# Patient Record
Sex: Male | Born: 1995 | Race: White | Hispanic: No | Marital: Single | Smoking: Current every day smoker
Health system: Southern US, Community
[De-identification: ages and names within clinical notes are randomized; demographics above are authoritative.]

## PROBLEM LIST (undated history)

## (undated) DIAGNOSIS — J45909 Unspecified asthma, uncomplicated: Secondary | ICD-10-CM

## (undated) HISTORY — PX: TONSILLECTOMY: SUR1361

---
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
## 2015-10-25 ENCOUNTER — Encounter: Payer: Self-pay | Admitting: *Deleted

## 2015-10-25 ENCOUNTER — Emergency Department
Admission: EM | Admit: 2015-10-25 | Discharge: 2015-10-25 | Disposition: A | Discharge status: Home / Self Care | Payer: Medicaid Other | Attending: Emergency Medicine | Admitting: Emergency Medicine

## 2015-10-25 DIAGNOSIS — F17210 Nicotine dependence, cigarettes, uncomplicated: Secondary | ICD-10-CM | POA: Insufficient documentation

## 2015-10-25 DIAGNOSIS — Z0000 Encounter for general adult medical examination without abnormal findings: Secondary | ICD-10-CM

## 2015-10-25 DIAGNOSIS — Z5321 Procedure and treatment not carried out due to patient leaving prior to being seen by health care provider: Secondary | ICD-10-CM | POA: Diagnosis not present

## 2015-10-25 DIAGNOSIS — Z046 Encounter for general psychiatric examination, requested by authority: Secondary | ICD-10-CM | POA: Diagnosis present

## 2015-10-25 NOTE — ED Triage Notes (Signed)
Pt needs medical clearance for RTS , pt has been abusing Xanax with ETOH, pt last drank ETOH 2 nights ago, Xanax 2mg  today

## 2015-10-26 NOTE — ED Provider Notes (Signed)
I never evaluated this patient. Patient dismissed from ed prior to medical evaluation.   Nita Sicklearolina Johntavius Shepard, MD 10/26/15 2320

## 2017-05-28 ENCOUNTER — Emergency Department
Admission: EM | Admit: 2017-05-28 | Discharge: 2017-05-28 | Disposition: A | Discharge status: Home / Self Care | Payer: Self-pay | Attending: Emergency Medicine | Admitting: Emergency Medicine

## 2017-05-28 ENCOUNTER — Other Ambulatory Visit: Payer: Self-pay

## 2017-05-28 ENCOUNTER — Encounter: Payer: Self-pay | Admitting: Emergency Medicine

## 2017-05-28 ENCOUNTER — Emergency Department: Payer: Self-pay

## 2017-05-28 DIAGNOSIS — J45909 Unspecified asthma, uncomplicated: Secondary | ICD-10-CM | POA: Insufficient documentation

## 2017-05-28 DIAGNOSIS — N132 Hydronephrosis with renal and ureteral calculous obstruction: Secondary | ICD-10-CM | POA: Insufficient documentation

## 2017-05-28 DIAGNOSIS — F17210 Nicotine dependence, cigarettes, uncomplicated: Secondary | ICD-10-CM | POA: Insufficient documentation

## 2017-05-28 DIAGNOSIS — N201 Calculus of ureter: Secondary | ICD-10-CM

## 2017-05-28 DIAGNOSIS — Z79899 Other long term (current) drug therapy: Secondary | ICD-10-CM | POA: Insufficient documentation

## 2017-05-28 DIAGNOSIS — R109 Unspecified abdominal pain: Secondary | ICD-10-CM

## 2017-05-28 HISTORY — DX: Unspecified asthma, uncomplicated: J45.909

## 2017-05-28 LAB — URINALYSIS, COMPLETE (UACMP) WITH MICROSCOPIC
Bacteria, UA: NONE SEEN
Bilirubin Urine: NEGATIVE
Glucose, UA: NEGATIVE mg/dL
Ketones, ur: 5 mg/dL — AB
Leukocytes, UA: NEGATIVE
NITRITE: NEGATIVE
PROTEIN: 30 mg/dL — AB
RBC / HPF: >50 RBC/hpf — ABNORMAL HIGH (ref 0–5)
SPECIFIC GRAVITY, URINE: 1.02 (ref 1.005–1.030)
pH: 6 (ref 5.0–8.0)

## 2017-05-28 LAB — CBC
HCT: 49.4 % (ref 40.0–52.0)
HEMOGLOBIN: 16.4 g/dL (ref 13.0–18.0)
MCH: 27.9 pg (ref 26.0–34.0)
MCHC: 33.2 g/dL (ref 32.0–36.0)
MCV: 83.9 fL (ref 80.0–100.0)
Platelets: 228 10*3/uL (ref 150–440)
RBC: 5.88 MIL/uL (ref 4.40–5.90)
RDW: 13.7 % (ref 11.5–14.5)
WBC: 17.2 10*3/uL — AB (ref 3.8–10.6)

## 2017-05-28 LAB — BASIC METABOLIC PANEL
ANION GAP: 11 (ref 5–15)
BUN: 10 mg/dL (ref 6–20)
CHLORIDE: 102 mmol/L (ref 101–111)
CO2: 25 mmol/L (ref 22–32)
Calcium: 9.5 mg/dL (ref 8.9–10.3)
Creatinine, Ser: 1.06 mg/dL (ref 0.61–1.24)
Glucose, Bld: 147 mg/dL — ABNORMAL HIGH (ref 65–99)
POTASSIUM: 4.5 mmol/L (ref 3.5–5.1)
Sodium: 138 mmol/L (ref 135–145)

## 2017-05-28 MED ORDER — SODIUM CHLORIDE 0.9 % IV BOLUS
1000.0000 mL | Freq: Once | INTRAVENOUS | Status: AC
  Administered 2017-05-28: 1000 mL via INTRAVENOUS

## 2017-05-28 MED ORDER — KETOROLAC TROMETHAMINE 60 MG/2ML IM SOLN
INTRAMUSCULAR | Status: AC
  Administered 2017-05-28: 30 mg
  Filled 2017-05-28: qty 2

## 2017-05-28 MED ORDER — ONDANSETRON HCL 4 MG/2ML IJ SOLN
4.0000 mg | Freq: Once | INTRAMUSCULAR | Status: AC
  Administered 2017-05-28: 4 mg via INTRAVENOUS
  Filled 2017-05-28: qty 2

## 2017-05-28 MED ORDER — HYDROMORPHONE HCL 1 MG/ML IJ SOLN
1.0000 mg | Freq: Once | INTRAMUSCULAR | Status: AC
  Administered 2017-05-28: 1 mg via INTRAVENOUS
  Filled 2017-05-28: qty 1

## 2017-05-28 MED ORDER — OXYCODONE-ACETAMINOPHEN 5-325 MG PO TABS: 1.0000 | ORAL_TABLET | Freq: Four times a day (QID) | ORAL | 0 refills | Status: AC | PRN

## 2017-05-28 MED ORDER — KETOROLAC TROMETHAMINE 10 MG PO TABS: 10.0000 mg | ORAL_TABLET | Freq: Four times a day (QID) | ORAL | 0 refills | Status: AC | PRN

## 2017-05-28 MED ORDER — HYDROMORPHONE HCL 1 MG/ML IJ SOLN
0.5000 mg | Freq: Once | INTRAMUSCULAR | Status: AC
  Administered 2017-05-28: 0.5 mg via INTRAVENOUS
  Filled 2017-05-28: qty 1

## 2017-05-28 MED ORDER — ONDANSETRON 4 MG PO TBDP: 4.0000 mg | ORAL_TABLET | Freq: Three times a day (TID) | ORAL | 0 refills | Status: AC | PRN

## 2017-05-28 MED ORDER — SULFAMETHOXAZOLE-TRIMETHOPRIM 800-160 MG PO TABS: 1.0000 | ORAL_TABLET | Freq: Two times a day (BID) | ORAL | 0 refills | Status: DC

## 2017-05-28 MED ORDER — KETOROLAC TROMETHAMINE 30 MG/ML IJ SOLN: 30.0000 mg | Freq: Once | INTRAMUSCULAR | Status: DC

## 2017-05-28 NOTE — ED Notes (Signed)
Spoke with pt about wait times and what to expect next. Advised pt that I am available for further questions if needed.  

## 2017-05-28 NOTE — ED Notes (Signed)
See triage note presents with sudden onset of left flank pain this am    Positive nausea /vomiting    States pain increases cough and movement   abd is soft and non tender at present  States pain was eased off by meds and he was able to go to sleep for a short while   Now states pain is back

## 2017-05-28 NOTE — ED Provider Notes (Signed)
[facility] Emergency Department Provider Note   ____________________________________________   First MD Initiated Contact with Patient 05/28/17 1307     (approximate)  I have reviewed the triage vital signs and the nursing notes.   HISTORY  Chief Complaint Flank Pain  HPI Bobby Vang is a 22 y.o. male presents to the emergency department with complaint of left flank pain that started suddenly today.  Patient states that he has pain with urination, nausea and vomiting this morning.  Patient denies any previous history of kidney stones and denies injury to the flank area.  He denies any hematuria, fever, chills, penile discharge.  He rates pain as 10/10.  Past Medical History:  Diagnosis Date  . Asthma     There are no active problems to display for this patient.   Past Surgical History:  Procedure Laterality Date  . TONSILLECTOMY      Prior to Admission medications   Medication Sig Start Date End Date Taking? Authorizing Provider  ketorolac (TORADOL) 10 MG tablet Take 1 tablet (10 mg total) by mouth every 6 (six) hours as needed for moderate pain. 05/28/17   Tommi Rumps, PA-C  ondansetron (ZOFRAN ODT) 4 MG disintegrating tablet Take 1 tablet (4 mg total) by mouth every 8 (eight) hours as needed for nausea or vomiting. 05/28/17   Tommi Rumps, PA-C  oxyCODONE-acetaminophen (PERCOCET) 5-325 MG tablet Take 1 tablet by mouth every 6 (six) hours as needed for severe pain. 05/28/17   Tommi Rumps, PA-C  sulfamethoxazole-trimethoprim (BACTRIM DS,SEPTRA DS) 800-160 MG tablet Take 1 tablet by mouth 2 (two) times daily. 05/28/17   Tommi Rumps, PA-C    Allergies Amoxicillin  History reviewed. No pertinent family history.  Social History Social History   Tobacco Use  . Smoking status: Current Every Day Smoker    Packs/day: 1.50    Types: Cigarettes  . Smokeless tobacco: Never Used  Substance Use Topics  . Alcohol use: Yes      Alcohol/week: 50.4 oz    Types: 84 Cans of beer per week    Comment: "not often"  . Drug use: Yes    Types: Marijuana    Review of Systems Constitutional: No fever/chills Cardiovascular: Denies chest pain. Respiratory: Denies shortness of breath. Gastrointestinal: No abdominal pain.  Positive nausea, positive vomiting.  Genitourinary: Positive for dysuria.  Negative for hematuria. Musculoskeletal: Negative for back pain.  For left flank pain. Skin: Negative for rash. Neurological: Negative for headaches, focal weakness or numbness. ___________________________________________   PHYSICAL EXAM:  VITAL SIGNS: ED Triage Vitals  Enc Vitals Group     BP 05/28/17 1303 (!) 137/53     Pulse Rate 05/28/17 1303 60     Resp 05/28/17 1303 (!) 22     Temp 05/28/17 1303 (!) 97.5 F (36.4 C)     Temp Source 05/28/17 1303 Oral     SpO2 05/28/17 1303 100 %     Weight 05/28/17 1132 186 lb (84.4 kg)     Height 05/28/17 1132  (1.727 m)     Head Circumference --      Peak Flow --      Pain Score 05/28/17 1131 10     Pain Loc --      Pain Edu? --      Excl. in GC? --    Constitutional: Alert and oriented. Well appearing and in no distress at this time however the patient is lying on his side  with his knees brought up to his chest. Eyes: Conjunctivae are normal.  Head: Atraumatic. Neck: No stridor.   Cardiovascular: Normal rate, regular rhythm. Grossly normal heart sounds.  Good peripheral circulation. Respiratory: Normal respiratory effort.  No retractions. Lungs CTAB. Gastrointestinal: Soft and nontender. No distention.  Positive left CVA tenderness. Musculoskeletal: Moves upper and lower extremities without any difficulty. Neurologic:  Normal speech and language. No gross focal neurologic deficits are appreciated. No gait instability. Skin:  Skin is warm, dry and intact. No rash noted. Psychiatric: Mood and affect are normal. Speech and behavior are  normal.  ____________________________________________   LABS (all labs ordered are listed, but only abnormal results are displayed)  Labs Reviewed  URINALYSIS, COMPLETE (UACMP) WITH MICROSCOPIC - Abnormal; Notable for the following components:      Result Value   Color, Urine YELLOW (*)    APPearance HAZY (*)    Hgb urine dipstick LARGE (*)    Ketones, ur 5 (*)    Protein, ur 30 (*)    RBC / HPF >50 (*)    All other components within normal limits  BASIC METABOLIC PANEL - Abnormal; Notable for the following components:   Glucose, Bld 147 (*)    All other components within normal limits  CBC - Abnormal; Notable for the following components:   WBC 17.2 (*)    All other components within normal limits   RADIOLOGY   Official radiology report(s): Ct Renal Stone Study  Result Date: 05/28/2017 CLINICAL DATA:  Left flank pain and dysuria.  Nausea and vomiting EXAM: CT ABDOMEN AND PELVIS WITHOUT CONTRAST TECHNIQUE: Multidetector CT imaging of the abdomen and pelvis was performed following the standard protocol without oral or IV contrast. COMPARISON:  None. FINDINGS: Lower chest: Lung bases are clear. Hepatobiliary: No focal liver lesions are evident on this noncontrast enhanced study. Gallbladder wall is not appreciably thickened. There is no biliary duct dilatation. Pancreas: No pancreatic mass or inflammatory focus. Spleen: Spleen measures 14.2 x 12.5 x 6.0 cm with a measured splenic volume of 533 cubic cm. No focal splenic lesions are evident. Adrenals/Urinary Tract: Adrenals bilaterally appear unremarkable. There is no renal mass on either side. There is mild hydronephrosis on left. There is no appreciable hydronephrosis on the right. There is no intrarenal calculus on either side. There is a 1 mm calculus in the left ureter at the L4-5 level. No other ureteral calculi are identified on either side. There are several small pelvic phleboliths which are near but separate from distal ureters.  Urinary bladder is midline with wall thickness within normal limits. Stomach/Bowel: There is no appreciable bowel wall or mesenteric thickening. There is no evident bowel obstruction. No free air or portal venous air. Vascular/Lymphatic: No abdominal aortic aneurysm. No vascular lesions are evident on this noncontrast enhanced study. There are subcentimeter inguinal lymph nodes considered nonspecific. By size criteria, there is no adenopathy in the abdomen or pelvis. Reproductive: Prostate and seminal vesicles are normal in size and contour. There is no pelvic mass. Other: Appendix appears normal. There is no ascites or abscess in the abdomen or pelvis. Musculoskeletal: There are no blastic or lytic bone lesions. There is no intramuscular or abdominal wall lesion. IMPRESSION: 1. 1 mm calculus in the left ureter at the L4 level with mild hydronephrosis on the left. No intrarenal calculi on either side. 2. Splenic prominence of uncertain etiology. No focal splenic lesions are evident on this noncontrast enhanced study. 3.  No bowel obstruction.  No abscess.  Appendix appears normal. Electronically Signed   By: Bretta Bang III M.D.   On: 05/28/2017 15:23    ____________________________________________   PROCEDURES  Procedure(s) performed: None  Procedures  Critical Care performed: No  ____________________________________________   INITIAL IMPRESSION / ASSESSMENT AND PLAN / ED COURSE  As part of my medical decision making, I reviewed the following data within the electronic MEDICAL RECORD NUMBER Notes from prior ED visits and [place] Controlled Substance Database  Patient was given Toradol IV, Zofran and was feeling better until he was seen in the flex area of the emergency department.  Patient then began having some increased pain.  He was given Dilaudid prior to CT scan.  While waiting for the results he again became nauseous and increased pain.  At this time he was given Dilaudid along with Zofran  and was improved prior to discharge.  Patient was made aware that he does have a small stone.  He was given information about stones along with prescription for Percocet, Toradol, Zofran, and Bactrim DS.  Patient is to follow-up with the urologist listed on his discharge papers.  He is encouraged to increase clear liquids especially water, lemonade, ginger ale or Sprite.  Patient was also given a note to remain out of work as he is aware that he cannot drive or operate machinery while taking pain medication.  His mother will also obtain Colace to avoid constipation.  ____________________________________________   FINAL CLINICAL IMPRESSION(S) / ED DIAGNOSES  Final diagnoses:  Ureterolithiasis  Left flank pain     ED Discharge Orders        Ordered    oxyCODONE-acetaminophen (PERCOCET) 5-325 MG tablet  Every 6 hours PRN     05/28/17 1603    ondansetron (ZOFRAN ODT) 4 MG disintegrating tablet  Every 8 hours PRN     05/28/17 1603    ketorolac (TORADOL) 10 MG tablet  Every 6 hours PRN     05/28/17 1603    sulfamethoxazole-trimethoprim (BACTRIM DS,SEPTRA DS) 800-160 MG tablet  2 times daily     05/28/17 1606       Note:  This document was prepared using Dragon voice recognition software and may include unintentional dictation errors.    Tommi Rumps, PA-C 05/28/17 1634    Rockne Menghini, MD 05/29/17 2049

## 2017-05-28 NOTE — ED Triage Notes (Signed)
Pt to ED from home c/o left flank pain that started suddenly today, states pain with urination, nausea and vomiting x5-6, denies kidney stone hx or injury.

## 2017-05-28 NOTE — ED Notes (Signed)
States last BM was this morning before pain started.

## 2017-05-28 NOTE — Discharge Instructions (Signed)
Call make an appointment with the urologist listed on your discharge papers for continued treatment.  Increase fluids such as clear liquids.  Also obtain over-the-counter Colace as a stool softener to prevent constipation while taking pain medication.  Percocet 1 every 6 hours as needed for severe pain.  Toradol 10 mg 1 tablet 4 times a day with food for inflammation and Zofran every 8 hours as needed for nausea.  Take antibiotic for infection prevention until finished.

## 2017-06-27 ENCOUNTER — Other Ambulatory Visit: Payer: Self-pay | Admitting: Emergency Medicine

## 2019-12-04 ENCOUNTER — Encounter (HOSPITAL_COMMUNITY): Payer: Self-pay | Admitting: *Deleted

## 2019-12-04 ENCOUNTER — Emergency Department (HOSPITAL_COMMUNITY)
Admission: EM | Admit: 2019-12-04 | Discharge: 2019-12-04 | Disposition: A | Discharge status: Home / Self Care | Payer: Self-pay | Attending: Emergency Medicine | Admitting: Emergency Medicine

## 2019-12-04 ENCOUNTER — Other Ambulatory Visit: Payer: Self-pay

## 2019-12-04 ENCOUNTER — Emergency Department (HOSPITAL_COMMUNITY): Payer: Self-pay

## 2019-12-04 DIAGNOSIS — W540XXA Bitten by dog, initial encounter: Secondary | ICD-10-CM | POA: Insufficient documentation

## 2019-12-04 DIAGNOSIS — J45909 Unspecified asthma, uncomplicated: Secondary | ICD-10-CM | POA: Insufficient documentation

## 2019-12-04 DIAGNOSIS — Y9289 Other specified places as the place of occurrence of the external cause: Secondary | ICD-10-CM | POA: Insufficient documentation

## 2019-12-04 DIAGNOSIS — F17210 Nicotine dependence, cigarettes, uncomplicated: Secondary | ICD-10-CM | POA: Insufficient documentation

## 2019-12-04 DIAGNOSIS — Z23 Encounter for immunization: Secondary | ICD-10-CM | POA: Insufficient documentation

## 2019-12-04 DIAGNOSIS — S81851A Open bite, right lower leg, initial encounter: Secondary | ICD-10-CM | POA: Insufficient documentation

## 2019-12-04 DIAGNOSIS — Y990 Civilian activity done for income or pay: Secondary | ICD-10-CM | POA: Insufficient documentation

## 2019-12-04 MED ORDER — LIDOCAINE HCL (PF) 1 % IJ SOLN
5.0000 mL | Freq: Once | INTRAMUSCULAR | Status: AC
Start: 2019-12-04 — End: 2019-12-04

## 2019-12-04 MED ORDER — CLINDAMYCIN HCL 150 MG PO CAPS
450.0000 mg | ORAL_CAPSULE | Freq: Once | ORAL | Status: AC
Start: 2019-12-04 — End: 2019-12-04
  Administered 2019-12-04: 450 mg via ORAL
  Filled 2019-12-04: qty 3

## 2019-12-04 MED ORDER — SULFAMETHOXAZOLE-TRIMETHOPRIM 800-160 MG PO TABS
1.0000 | ORAL_TABLET | Freq: Once | ORAL | Status: AC
  Administered 2019-12-04: 1 via ORAL
  Filled 2019-12-04: qty 1

## 2019-12-04 MED ORDER — TETANUS-DIPHTH-ACELL PERTUSSIS 5-2.5-18.5 LF-MCG/0.5 IM SUSY
0.5000 mL | PREFILLED_SYRINGE | Freq: Once | INTRAMUSCULAR | Status: AC
  Administered 2019-12-04: 0.5 mL via INTRAMUSCULAR
  Filled 2019-12-04: qty 0.5

## 2019-12-04 MED ORDER — LIDOCAINE HCL (PF) 1 % IJ SOLN
INTRAMUSCULAR | Status: AC
  Administered 2019-12-04: 5 mL
  Filled 2019-12-04: qty 5

## 2019-12-04 MED ORDER — CLINDAMYCIN HCL 150 MG PO CAPS: 450.0000 mg | ORAL_CAPSULE | Freq: Three times a day (TID) | ORAL | 0 refills | Status: AC

## 2019-12-04 MED ORDER — SULFAMETHOXAZOLE-TRIMETHOPRIM 800-160 MG PO TABS: 1.0000 | ORAL_TABLET | Freq: Two times a day (BID) | ORAL | 0 refills | Status: AC

## 2019-12-04 NOTE — ED Notes (Signed)
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

## 2019-12-04 NOTE — ED Provider Notes (Signed)
[facility] EMERGENCY DEPARTMENT Provider Note   CSN: [id number] Arrival date & time: 12/04/19  1237     History Chief Complaint  Patient presents with  . Animal Bite    Bobby Vang is a 24 y.o. male history of asthma otherwise healthy.  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  He reports immediate onset pain sharp nonradiating worse with palpation improved with rest.  He noted bleeding and immediately applied pressure.  Patient reports that animal control has already been contacted and they report that the dog is up-to-date on all vaccinations including rabies.  Patient denies fall, head injury, loss of consciousness, numbness/weakness, tingling, chest pain, back pain, abdominal pain, neck pain or any additional injuries or concerns. HPI     Past Medical History:  Diagnosis Date  . Asthma     There are no problems to display for this patient.   Past Surgical History:  Procedure Laterality Date  . TONSILLECTOMY         History reviewed. No pertinent family history.  Social History   Tobacco Use  . Smoking status: Current Every Day Smoker    Packs/day: 1.50    Types: Cigarettes  . Smokeless tobacco: Never Used  Substance Use Topics  . Alcohol use: Yes    Alcohol/week: 84.0 standard drinks    Types: 84 Cans of beer per week    Comment: "not often"  . Drug use: Yes    Types: Marijuana    Home Medications Prior to Admission medications   Medication Sig Start Date End Date Taking? Authorizing Provider  clindamycin (CLEOCIN) 150 MG capsule Take 3 capsules (450 mg total) by mouth 3 (three) times daily for 10 days. 12/04/19 12/14/19  Harlene Salts A, PA-C  ketorolac (TORADOL) 10 MG tablet Take 1 tablet (10 mg total) by mouth every 6 (six) hours as needed for moderate pain. 05/28/17   Tommi Rumps, PA-C  ondansetron (ZOFRAN ODT) 4 MG disintegrating tablet Take 1  tablet (4 mg total) by mouth every 8 (eight) hours as needed for nausea or vomiting. 05/28/17   Tommi Rumps, PA-C  oxyCODONE-acetaminophen (PERCOCET) 5-325 MG tablet Take 1 tablet by mouth every 6 (six) hours as needed for severe pain. 05/28/17   Tommi Rumps, PA-C  sulfamethoxazole-trimethoprim (BACTRIM DS) 800-160 MG tablet Take 1 tablet by mouth 2 (two) times daily for 10 days. 12/04/19 12/14/19  Harlene Salts A, PA-C    Allergies    Amoxicillin  Review of Systems   Review of Systems  Constitutional: Negative.  Negative for chills and fever.  Cardiovascular: Negative.  Negative for chest pain.  Gastrointestinal: Negative.  Negative for abdominal pain.  Musculoskeletal: Negative.  Negative for back pain and neck pain.  Skin: Positive for wound.  Neurological: Negative.  Negative for syncope, weakness, numbness and headaches.    Physical Exam Updated Vital Signs BP 135/68 (BP Location: Right Arm)   Pulse 83   Temp 98.4 F (36.9 C) (Oral)   Resp 20   Ht 5' 9.5" (1.765 m)   Wt 86.2 kg   SpO2 96%   BMI 27.66 kg/m   Physical Exam Constitutional:      General: He is not in acute distress.    Appearance: Normal appearance. He is well-developed. He is not ill-appearing or diaphoretic.  HENT:     Head: Normocephalic and atraumatic.  Eyes:  General: Vision grossly intact. Gaze aligned appropriately.     Pupils: Pupils are equal, round, and reactive to light.  Neck:     Trachea: Trachea and phonation normal.  Cardiovascular:     Rate and Rhythm: Normal rate and regular rhythm.     Pulses:          Dorsalis pedis pulses are 2+ on the right side and 2+ on the left side.  Pulmonary:     Effort: Pulmonary effort is normal. No respiratory distress.  Abdominal:     General: There is no distension.     Palpations: Abdomen is soft.     Tenderness: There is no abdominal tenderness. There is no guarding or rebound.  Musculoskeletal:        General: Normal range of  motion.     Cervical back: Normal range of motion.     Comments: Full range of motion and strength at the bilateral hip knee ankle and foot without pain.  Feet:     Right foot:     Protective Sensation: 5 sites tested. 5 sites sensed.     Left foot:     Protective Sensation: 5 sites tested. 5 sites sensed.  Skin:    General: Skin is warm and dry.     Capillary Refill: Capillary refill takes less than 2 seconds.          Comments: 2 and half centimeter gaping laceration to the superior posterior right lower leg as pictured below. No debris is or foreign bodies evident.  Smaller abrasions and lacerations noted inferiorly and medially.  Neurological:     Mental Status: He is alert.     GCS: GCS eye subscore is 4. GCS verbal subscore is 5. GCS motor subscore is 6.     Comments: Speech is clear and goal oriented, follows commands Major Cranial nerves without deficit, no facial droop Moves extremities without ataxia, coordination intact  Psychiatric:        Behavior: Behavior normal.       ED Results / Procedures / Treatments   Labs (all labs ordered are listed, but only abnormal results are displayed) Labs Reviewed - No data to display  EKG None  Radiology DG Tibia/Fibula Right  Result Date: 12/04/2019 CLINICAL DATA:  Dog bite to the back of the leg, bleeding. EXAM: RIGHT TIBIA AND FIBULA - 2 VIEW COMPARISON:  None. FINDINGS: There is gas in the soft tissues of the posterior proximal calf, primarily medially. This gas seems to mostly be in the subcutaneous tissues. No gas in the knee joint is appreciated. No bony abnormality or retained canine tooth identified. IMPRESSION: 1. Gas in the soft tissues of the posterior proximal calf, primarily medially, and likely entirely or mostly in the subcutaneous tissues. No foreign body or gas in the knee joint. 2. No bony abnormality. Electronically Signed   By: Gaylyn Rong M.D.   On: 12/04/2019 14:47    Procedures Procedures  (including critical care time)  Medications Ordered in ED Medications  Tdap (BOOSTRIX) injection 0.5 mL (has no administration in time range)  clindamycin (CLEOCIN) capsule 450 mg (450 mg Oral Given 12/04/19 1517)  sulfamethoxazole-trimethoprim (BACTRIM DS) 800-160 MG per tablet 1 tablet (1 tablet Oral Given 12/04/19 1517)  lidocaine (PF) (XYLOCAINE) 1 % injection 5 mL (5 mLs Infiltration Given 12/04/19 1516)    ED Course  I have reviewed the triage vital signs and the nursing notes.  Pertinent labs & imaging results that were available during  my care of the patient were reviewed by me and considered in my medical decision making (see chart for details).    MDM Rules/Calculators/A&P                         Additional history obtained from: 1. Nursing notes from this visit.   Bobby Vang is a 24 y.o. male who presents to after a dog bite to the right lower leg just prior to arrival.  He has 1 significant laceration to the superior posterior right lower calf.  He also has a small laceration and some abrasions to the area as well.  The most significant laceration does have some subcutaneous tissue involvement, no muscle belly involvement.  No significant vessel involvement.  X-ray was obtained shows subcutaneous gas which is consistent with laceration, he has no knee pain and does not appear to have any joint involvement today.  He remains neurovascularly intact distal to injury.  Patient reports that the animal was up-to-date on rabies vaccination.  Will update patient's Tdap today.  He has amoxicillin allergy will start patient on clindamycin and Bactrim for infection prophylaxis for the next 10 days. Wound thoroughly cleaned in ED today. Wound explored and bottom of wound seen in a bloodless field.  Wound was initially irrigated by nursing staff, I then reirrigated the wound with large amount of saline.  Case discussed with Dr. Rhunette Croft who agrees with antibiotic choices, will offer  patient a single stitch due to the significant gaping of the wound and discharged with outpatient follow-up. - Patient refused a single stitch today which is reasonable, he was informed of strict return precautions and he and his girlfriend stated understanding.  At this time there does not appear to be any evidence of an acute emergency medical condition and the patient appears stable for discharge with appropriate outpatient follow up. Diagnosis was discussed with patient who verbalizes understanding of care plan and is agreeable to discharge. I have discussed return precautions with patient who verbalizes understanding. Patient encouraged to follow-up with their PCP. All questions answered.  Note: Portions of this report may have been transcribed using voice recognition software. Every effort was made to ensure accuracy; however, inadvertent computerized transcription errors may still be present.  Final Clinical Impression(s) / ED Diagnoses Final diagnoses:  Dog bite, initial encounter    Rx / DC Orders ED Discharge Orders         Ordered    clindamycin (CLEOCIN) 150 MG capsule  3 times daily        12/04/19 1557    sulfamethoxazole-trimethoprim (BACTRIM DS) 800-160 MG tablet  2 times daily        12/04/19 1557           Elizabeth Palau 12/04/19 1613    Derwood Kaplan, MD 12/05/19 0730

## 2019-12-04 NOTE — Discharge Instructions (Addendum)
At this time there does not appear to be the presence of an emergent medical condition, however there is always the potential for conditions to change. Please read and follow the below instructions.  Please return to the Emergency Department immediately for any new or worsening symptoms. Please be sure to follow up with your Primary Care Provider within one week regarding your visit today; please call their office to schedule an appointment even if you are feeling better for a follow-up visit. Please take your antibiotic clindamycin and Bactrim as prescribed until complete to help with your symptoms.  Please drink enough water to avoid dehydration and get plenty of rest. Please keep your wound clean and rinse gently with clean soapy water twice a day and keep bandaged with sterile gauze. You may use a small mount of antibiotic ointment for the next few days. Please have your wound rechecked by your primary care doctor in the next week to ensure healing.  Go to the nearest Emergency Department immediately if: You have fever or chills You have a red streak going away from your wound. You have any of these coming from your wound: Non-clear fluid. More blood. Pus or a bad smell. You have trouble moving your injured area. You lose feeling (have numbness) or feel tingling anywhere on your body.  Please read the additional information packets attached to your discharge summary.  Do not take your medicine if  develop an itchy rash, swelling in your mouth or lips, or difficulty breathing; call 911 and seek immediate emergency medical attention if this occurs.  You may review your lab tests and imaging results in their entirety on your MyChart account.  Please discuss all results of fully with your primary care provider and other specialist at your follow-up visit.  Note: Portions of this text may have been transcribed using voice recognition software. Every effort was made to ensure accuracy; however,  inadvertent computerized transcription errors may still be present.

## 2019-12-04 NOTE — ED Triage Notes (Signed)
Pt with a dog bite to back of right lower leg.  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  [facility] reports that Animal Control was called and owner has been contacted, reported that dog was up to date on shots.
